# Patient Record
Sex: Male | Born: 1960 | Race: Black or African American | Hispanic: No | Marital: Married | Smoking: Never smoker
Health system: Southern US, Community
[De-identification: ages and names within clinical notes are randomized; demographics above are authoritative.]

## PROBLEM LIST (undated history)

## (undated) DIAGNOSIS — E7800 Pure hypercholesterolemia, unspecified: Secondary | ICD-10-CM

## (undated) DIAGNOSIS — I10 Essential (primary) hypertension: Secondary | ICD-10-CM

## (undated) DIAGNOSIS — K219 Gastro-esophageal reflux disease without esophagitis: Secondary | ICD-10-CM

## (undated) HISTORY — PX: ANKLE FRACTURE SURGERY: SHX122

---

## 2011-09-27 ENCOUNTER — Emergency Department (HOSPITAL_BASED_OUTPATIENT_CLINIC_OR_DEPARTMENT_OTHER)
Admission: EM | Admit: 2011-09-27 | Discharge: 2011-09-27 | Disposition: A | Discharge status: Home / Self Care | Payer: Managed Care, Other (non HMO) | Attending: Emergency Medicine | Admitting: Emergency Medicine

## 2011-09-27 ENCOUNTER — Encounter (HOSPITAL_BASED_OUTPATIENT_CLINIC_OR_DEPARTMENT_OTHER): Payer: Self-pay | Admitting: *Deleted

## 2011-09-27 DIAGNOSIS — W261XXA Contact with sword or dagger, initial encounter: Secondary | ICD-10-CM | POA: Insufficient documentation

## 2011-09-27 DIAGNOSIS — W260XXA Contact with knife, initial encounter: Secondary | ICD-10-CM | POA: Insufficient documentation

## 2011-09-27 DIAGNOSIS — S61209A Unspecified open wound of unspecified finger without damage to nail, initial encounter: Secondary | ICD-10-CM | POA: Insufficient documentation

## 2011-09-27 DIAGNOSIS — Z79899 Other long term (current) drug therapy: Secondary | ICD-10-CM | POA: Insufficient documentation

## 2011-09-27 DIAGNOSIS — S61219A Laceration without foreign body of unspecified finger without damage to nail, initial encounter: Secondary | ICD-10-CM

## 2011-09-27 DIAGNOSIS — M79609 Pain in unspecified limb: Secondary | ICD-10-CM | POA: Insufficient documentation

## 2011-09-27 DIAGNOSIS — Z7982 Long term (current) use of aspirin: Secondary | ICD-10-CM | POA: Insufficient documentation

## 2011-09-27 MED ORDER — LIDOCAINE HCL 2 % IJ SOLN
10.0000 mL | Freq: Once | INTRAMUSCULAR | Status: DC
  Filled 2011-09-27: qty 1

## 2011-09-27 NOTE — Discharge Instructions (Signed)
Fingertip Laceration  The treatment of fingertip injuries depends on how large the cut is and whether the bone or nail tissue has been damaged. Amputations of the skin over the tip of the finger that is smaller than a dime (smaller than 1cm) will usually heal very well from the sides without any treatment other than cleaning the wound and changing the dressing.  Keep your hand elevated for the next 2 to 3 days to reduce pain and swelling. A splint over the fingertip may be needed to protect your injury. If your cut is being allowed to heal in from the sides, you should soak it in warm water and change the dressing daily.   You may need a tetanus shot if:   You cannot remember when you had your last tetanus shot.   You have never had a tetanus shot.   The injury broke your skin.  If you got a tetanus shot, your arm may swell, get red, and feel warm to the touch. This is common and not a problem. If you need a tetanus shot and you choose not to have one, there is a rare chance of getting tetanus. Sickness from tetanus can be serious.  SEEK MEDICAL CARE IF:    There are any signs of infection: increased redness, swelling, and pain, or sometimes pus drainage.  Document Released: 05/08/2004 Document Revised: 03/20/2011 Document Reviewed: 05/04/2008  [employer] Patient Information 2012 [employer].

## 2011-09-27 NOTE — ED Notes (Signed)
Pt states he cut his right index finger with a knife. Approx 1 cm lac to same. Bleeding controlled.

## 2011-09-27 NOTE — ED Provider Notes (Signed)
History     CSN: [id number]  Arrival date & time 09/27/11  2017   First MD Initiated Contact with Patient 09/27/11 2101      10 PM HPI Patient reports reaching his right hand into it to her. States he accidentally stabbed his right distal index finger with a knife. Reports bleeding significantly better controlled with pressure. Denies numbness, tingling, decreased range of motion. Patient is a 51 y.o. male presenting with skin laceration. The history is provided by the patient.  Laceration  The incident occurred 1 to 2 hours ago. The laceration is located on the right hand. The laceration is 1 cm in size. The laceration mechanism was a a clean knife. The pain is moderate. The pain has been constant since onset. His tetanus status is UTD.    History reviewed. No pertinent past medical history.  History reviewed. No pertinent past surgical history.  History reviewed. No pertinent family history.  History  Substance Use Topics  . Smoking status: Never Smoker   . Smokeless tobacco: Not on file  . Alcohol Use: Yes      Review of Systems  Constitutional: Negative for fever.  Skin: Positive for wound (laceration). Negative for color change.  All other systems reviewed and are negative.    Allergies  Review of patient's allergies indicates no known allergies.  Home Medications   Current Outpatient Rx  Name Route Sig Dispense Refill  . ASPIRIN 81 MG PO TABS Oral Take 81 mg by mouth daily.    . ADULT MULTIVITAMIN W/MINERALS CH Oral Take 1 tablet by mouth daily.    Marland Kitchen NAPROXEN SODIUM 220 MG PO TABS Oral Take 220 mg by mouth daily.    Marland Kitchen FISH OIL PO Oral Take 1 capsule by mouth daily.    Marland Kitchen OVER THE COUNTER MEDICATION Oral Take 1 tablet by mouth daily. Joint soother    . OVER THE COUNTER MEDICATION Oral Take 1 tablet by mouth daily. Vitamin for high blood pressure      BP 155/95  Pulse 84  Temp 98.5 F (36.9 C) (Oral)  Resp 20  Ht 5' 7.5" (1.715 m)  Wt 190 lb (86.183 kg)   BMI 29.32 kg/m2  SpO2 98%  Physical Exam  Vitals reviewed. Constitutional: He is oriented to person, place, and time. He appears well-developed and well-nourished.  HENT:  Head: Normocephalic and atraumatic.  Eyes: Pupils are equal, round, and reactive to light.  Musculoskeletal:       Small 1 cm laceration of right 2nd finger. No tendon or muscle laceration. Normal flexor and extensor function and strength against resistance. Hemostatic.   Neurological: He is alert and oriented to person, place, and time.  Skin: Skin is warm and dry. No rash noted. No erythema. No pallor.  Psychiatric: He has a normal mood and affect. His behavior is normal.    ED Course  Procedures   LACERATION REPAIR Performed by: Thomasene Lot Authorized by: Thomasene Lot Consent: Verbal consent obtained. Risks and benefits: risks, benefits and alternatives were discussed Consent given by: patient Patient identity confirmed: provided demographic data Prepped and Draped in normal sterile fashion Wound explored  Laceration Location: right index finger  Laceration Length: 1cm  No Foreign Bodies seen or palpated  Anesthesia: digital block  Local anesthetic: lidocaine 2% w/o epinephrine  Anesthetic total: 3 ml  Irrigation method: syringe Amount of cleaning: standard  Skin closure: Suture  Number of sutures: 3  Technique: simple interrupted   Patient tolerance: Patient tolerated  the procedure well with no immediate complications.   MDM   Advise removal of 3 sutures in 10 days. Also advised patient to monitor for signs of infection. Patient reports last tetanus was approximately 2 years ago and he had suture repair of left ankle. Patient voices understanding and is ready for discharge.      Thomasene Lot, PA-C 09/27/11 2350

## 2011-09-28 NOTE — ED Provider Notes (Signed)
Medical screening examination/treatment/procedure(s) were performed by non-physician practitioner and as supervising physician I was immediately available for consultation/collaboration.   Leigh-Ann Nhat Hearne, MD 09/28/11 0935 

## 2014-04-14 ENCOUNTER — Emergency Department (HOSPITAL_BASED_OUTPATIENT_CLINIC_OR_DEPARTMENT_OTHER)
Admission: EM | Admit: 2014-04-14 | Discharge: 2014-04-14 | Disposition: A | Discharge status: Home / Self Care | Payer: Managed Care, Other (non HMO) | Attending: Emergency Medicine | Admitting: Emergency Medicine

## 2014-04-14 ENCOUNTER — Emergency Department (HOSPITAL_BASED_OUTPATIENT_CLINIC_OR_DEPARTMENT_OTHER): Payer: Managed Care, Other (non HMO)

## 2014-04-14 ENCOUNTER — Encounter (HOSPITAL_BASED_OUTPATIENT_CLINIC_OR_DEPARTMENT_OTHER): Payer: Self-pay | Admitting: *Deleted

## 2014-04-14 DIAGNOSIS — Z8719 Personal history of other diseases of the digestive system: Secondary | ICD-10-CM | POA: Insufficient documentation

## 2014-04-14 DIAGNOSIS — I10 Essential (primary) hypertension: Secondary | ICD-10-CM | POA: Insufficient documentation

## 2014-04-14 DIAGNOSIS — Z79899 Other long term (current) drug therapy: Secondary | ICD-10-CM | POA: Insufficient documentation

## 2014-04-14 DIAGNOSIS — E780 Pure hypercholesterolemia: Secondary | ICD-10-CM | POA: Insufficient documentation

## 2014-04-14 DIAGNOSIS — Z7982 Long term (current) use of aspirin: Secondary | ICD-10-CM | POA: Diagnosis not present

## 2014-04-14 DIAGNOSIS — J069 Acute upper respiratory infection, unspecified: Secondary | ICD-10-CM

## 2014-04-14 DIAGNOSIS — R05 Cough: Secondary | ICD-10-CM | POA: Diagnosis present

## 2014-04-14 HISTORY — DX: Pure hypercholesterolemia, unspecified: E78.00

## 2014-04-14 HISTORY — DX: Gastro-esophageal reflux disease without esophagitis: K21.9

## 2014-04-14 HISTORY — DX: Essential (primary) hypertension: I10

## 2014-04-14 MED ORDER — HYDROCOD POLST-CHLORPHEN POLST 10-8 MG/5ML PO LQCR: 5.0000 mL | Freq: Two times a day (BID) | ORAL | Status: AC | PRN

## 2014-04-14 MED ORDER — IBUPROFEN 400 MG PO TABS
400.0000 mg | ORAL_TABLET | Freq: Once | ORAL | Status: AC
  Administered 2014-04-14: 400 mg via ORAL
  Filled 2014-04-14: qty 1

## 2014-04-14 NOTE — ED Provider Notes (Signed)
CSN: [id number]     Arrival date & time 04/14/14  1906 History   First MD Initiated Contact with Patient 04/14/14 2047     Chief Complaint  Patient presents with  . Cough     (Consider location/radiation/quality/duration/timing/severity/associated sxs/prior Treatment) HPI Comments: Pt comes in with c/o subjective fever, body aches, sore throat, headache and cough. Has tried otc medications without relief. No n/v/d, no cp or sob.  The history is provided by the patient. No language interpreter was used.    Past Medical History  Diagnosis Date  . Hypercholesterolemia   . Hypertension   . Acid reflux    Past Surgical History  Procedure Laterality Date  . Ankle fracture surgery     History reviewed. No pertinent family history. History  Substance Use Topics  . Smoking status: Never Smoker   . Smokeless tobacco: Not on file  . Alcohol Use: Yes     Comment: rare    Review of Systems  All other systems reviewed and are negative.     Allergies  Review of patient's allergies indicates no known allergies.  Home Medications   Prior to Admission medications   Medication Sig Start Date End Date Taking? Authorizing Provider  aspirin 81 MG tablet Take 81 mg by mouth daily.    Historical Provider, MD  Multiple Vitamin (MULTIVITAMIN WITH MINERALS) TABS Take 1 tablet by mouth daily.    Historical Provider, MD  naproxen sodium (ANAPROX) 220 MG tablet Take 220 mg by mouth daily.    Historical Provider, MD  Omega-3 Fatty Acids (FISH OIL PO) Take 1 capsule by mouth daily.    Historical Provider, MD  OVER THE COUNTER MEDICATION Take 1 tablet by mouth daily. Joint soother    Historical Provider, MD  OVER THE COUNTER MEDICATION Take 1 tablet by mouth daily. Vitamin for high blood pressure    Historical Provider, MD   BP 160/104 mmHg  Pulse 109  Temp(Src) 99.9 F (37.7 C) (Oral)  Resp 18  Wt 201 lb 12.8 oz (91.536 kg)  SpO2 96% Physical Exam  Constitutional: He appears  well-developed and well-nourished.  HENT:  Right Ear: External ear normal.  Left Ear: External ear normal.  Nose: Rhinorrhea present.  Mouth/Throat: Posterior oropharyngeal erythema present.  Cardiovascular: Normal rate and regular rhythm.   Pulmonary/Chest: Effort normal and breath sounds normal.  Abdominal: Soft. Bowel sounds are normal.  Musculoskeletal: Normal range of motion.  Neurological: He is alert.  Nursing note and vitals reviewed.   ED Course  Procedures (including critical care time) Labs Review Labs Reviewed - No data to display  Imaging Review Dg Chest 2 View  04/14/2014   CLINICAL DATA:  Cough.  EXAM: CHEST  2 VIEW  COMPARISON:  None.  FINDINGS: The heart size and mediastinal contours are within normal limits. Both lungs are clear. No pneumothorax or pleural effusion is noted. The visualized skeletal structures are unremarkable.  IMPRESSION: No acute cardiopulmonary abnormality seen.   Electronically Signed   By: Roque Lias M.D.   On: 04/14/2014 21:05     EKG Interpretation None      MDM   Final diagnoses:  URI (upper respiratory infection)    Will do symptomatic treatment for pt. No infection noted on x-ray    Teressa Lower, NP 04/14/14 2122  Rolan Bucco, MD 04/14/14 2127

## 2014-04-14 NOTE — ED Notes (Addendum)
C/o cough and subjective fever, also reports body aches, sore throat with cough and HA with cough. Onset 2d ago. Has taken mucinex and riccola cough drops. No antipyretics PTA. (denies: sob, dizziness or nvd).  LS CTA. Pt alert, NAD, calm, interactive. Steady gait. Family x2 present.

## 2014-04-14 NOTE — Discharge Instructions (Signed)
Upper Respiratory Infection, Adult An upper respiratory infection (URI) is also known as the common cold. It is often caused by a type of germ (virus). Colds are easily spread (contagious). You can pass it to others by kissing, coughing, sneezing, or drinking out of the same glass. Usually, you get better in 1 or 2 weeks.  HOME CARE   Only take medicine as told by your doctor.  Use a warm mist humidifier or breathe in steam from a hot shower.  Drink enough water and fluids to keep your pee (urine) clear or pale yellow.  Get plenty of rest.  Return to work when your temperature is back to normal or as told by your doctor. You may use a face mask and wash your hands to stop your cold from spreading. GET HELP RIGHT AWAY IF:   After the first few days, you feel you are getting worse.  You have questions about your medicine.  You have chills, shortness of breath, or brown or red spit (mucus).  You have yellow or brown snot (nasal discharge) or pain in the face, especially when you bend forward.  You have a fever, puffy (swollen) neck, pain when you swallow, or white spots in the back of your throat.  You have a bad headache, ear pain, sinus pain, or chest pain.  You have a high-pitched whistling sound when you breathe in and out (wheezing).  You have a lasting cough or cough up blood.  You have sore muscles or a stiff neck. MAKE SURE YOU:   Understand these instructions.  Will watch your condition.  Will get help right away if you are not doing well or get worse. Document Released: 09/17/2007 Document Revised: 06/23/2011 Document Reviewed: 07/06/2013 Exit[employer] Patient Information 2015 [employer]. This information is not intended to replace advice given to you by your health care provider. Make sure you discuss any questions you have with your health care provider.  

## 2015-12-10 IMAGING — CR DG CHEST 2V
2 series · 2 of 2 positions shown · non-contrast
Comparison: None.

CLINICAL DATA: Cough.

EXAM:
CHEST  2 VIEW

[w chest pa]
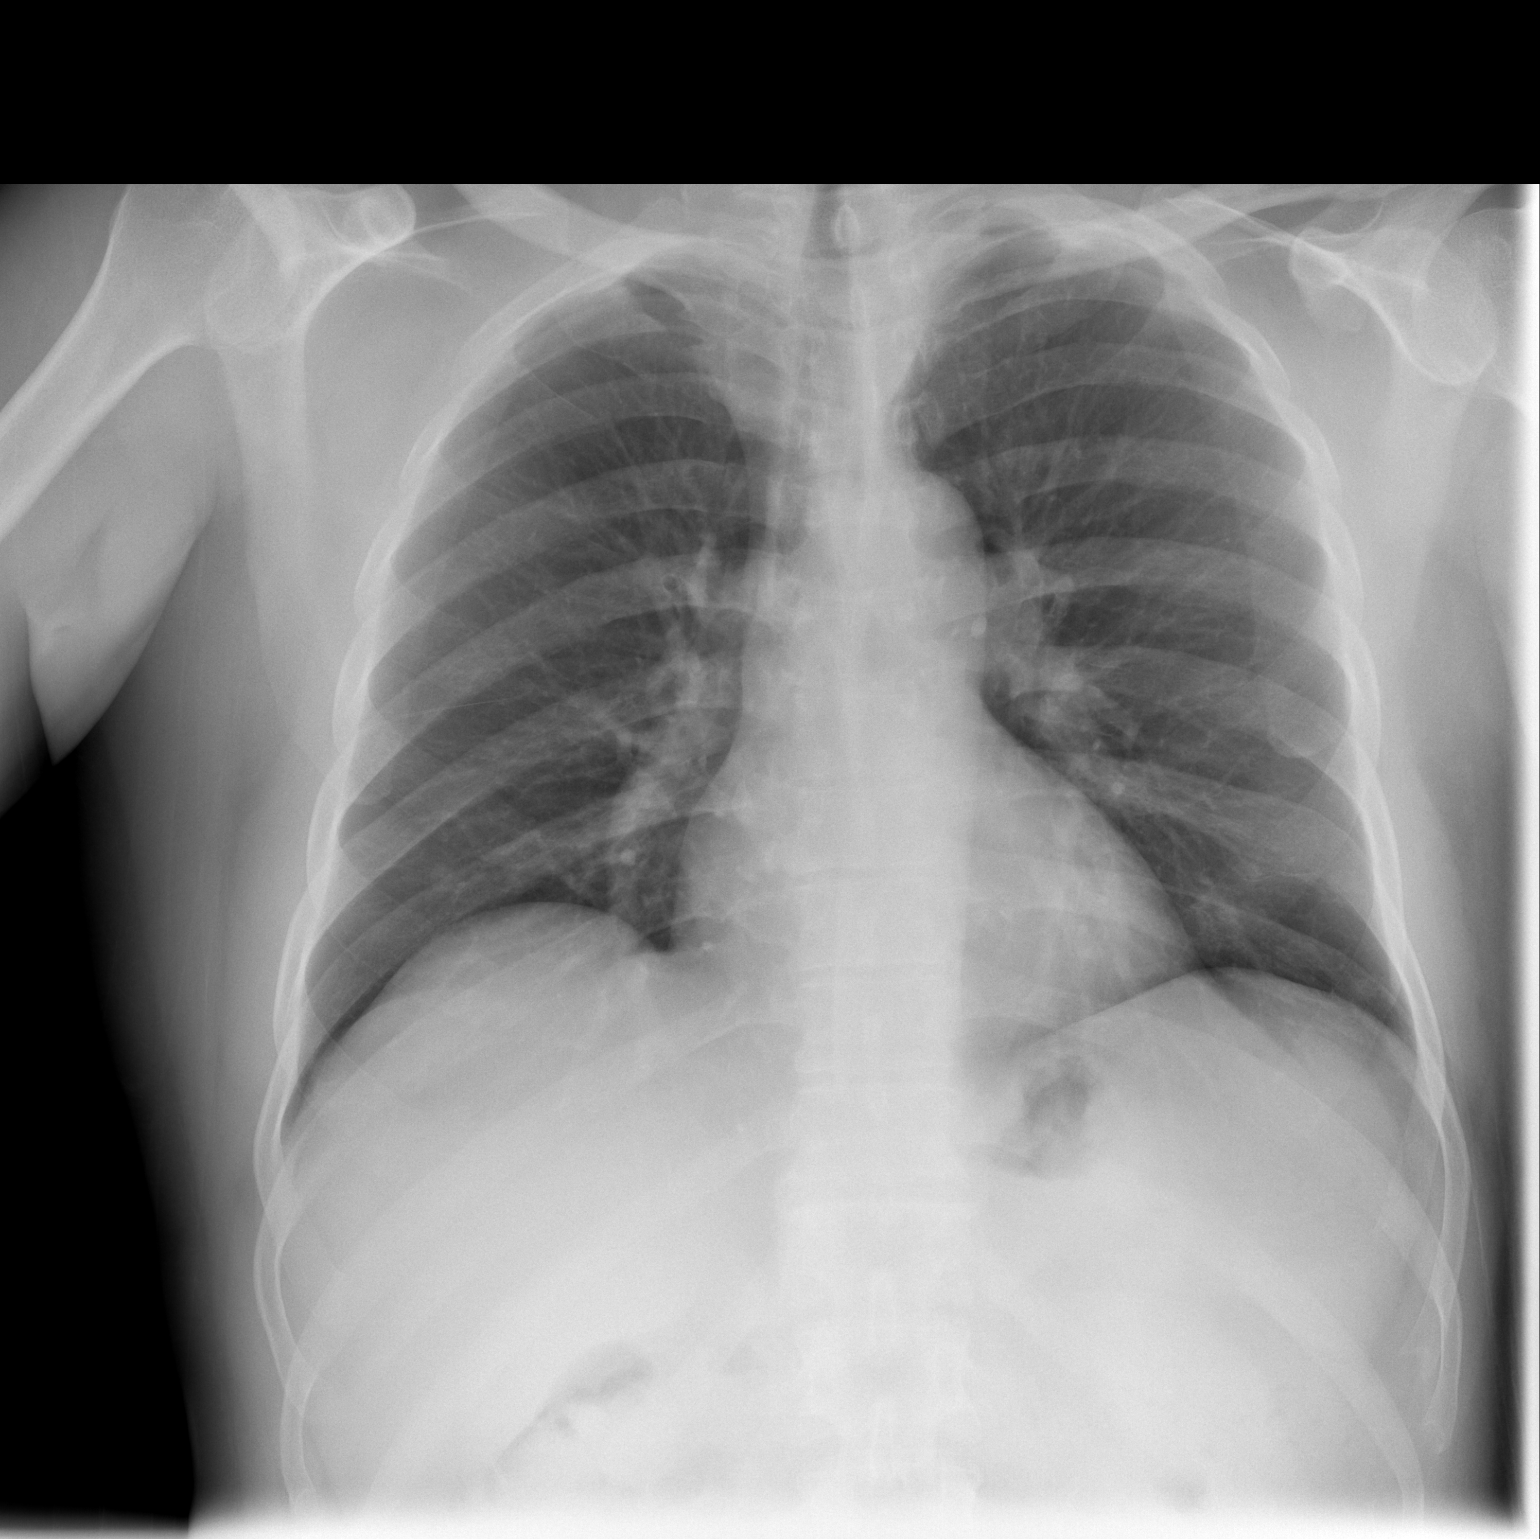

[w chest lat]
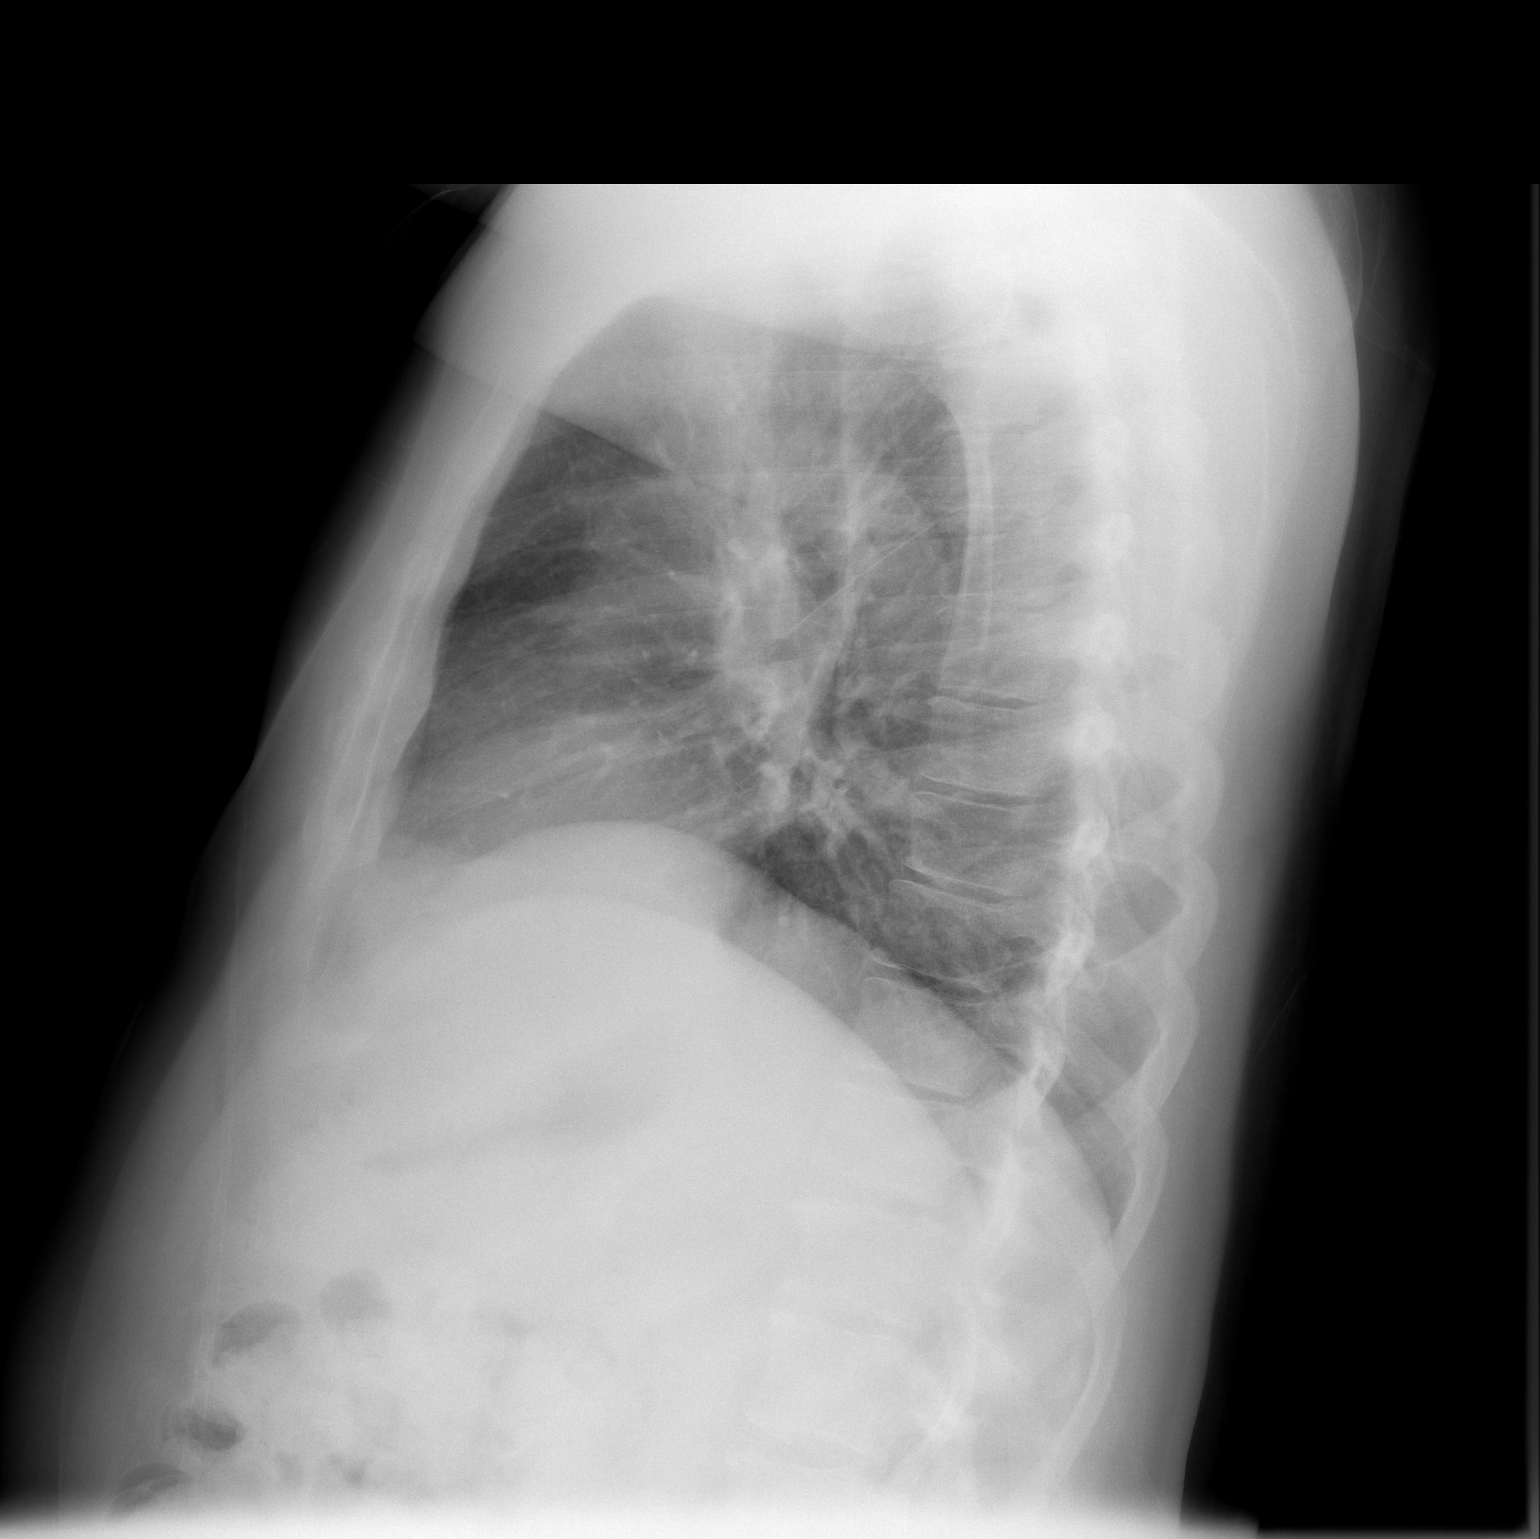

[2 of 2 positions shown; findings below may reference images not displayed]

FINDINGS: The heart size and mediastinal contours are within normal limits.
Both lungs are clear. No pneumothorax or pleural effusion is noted.
The visualized skeletal structures are unremarkable.
IMPRESSION: No acute cardiopulmonary abnormality seen.

## 2021-12-15 ENCOUNTER — Emergency Department (HOSPITAL_BASED_OUTPATIENT_CLINIC_OR_DEPARTMENT_OTHER)
Admission: EM | Admit: 2021-12-15 | Discharge: 2021-12-15 | Disposition: A | Discharge status: Home / Self Care | Payer: Managed Care, Other (non HMO) | Attending: Emergency Medicine | Admitting: Emergency Medicine

## 2021-12-15 ENCOUNTER — Encounter (HOSPITAL_BASED_OUTPATIENT_CLINIC_OR_DEPARTMENT_OTHER): Payer: Self-pay | Admitting: Emergency Medicine

## 2021-12-15 ENCOUNTER — Other Ambulatory Visit: Payer: Self-pay

## 2021-12-15 DIAGNOSIS — I10 Essential (primary) hypertension: Secondary | ICD-10-CM | POA: Diagnosis not present

## 2021-12-15 DIAGNOSIS — R369 Urethral discharge, unspecified: Secondary | ICD-10-CM

## 2021-12-15 DIAGNOSIS — L299 Pruritus, unspecified: Secondary | ICD-10-CM | POA: Diagnosis not present

## 2021-12-15 LAB — HIV ANTIBODY (ROUTINE TESTING W REFLEX): HIV Screen 4th Generation wRfx: NONREACTIVE

## 2021-12-15 MED ORDER — DOXYCYCLINE HYCLATE 100 MG PO CAPS: 100.0000 mg | ORAL_CAPSULE | Freq: Two times a day (BID) | ORAL | 0 refills | Status: AC

## 2021-12-15 MED ORDER — CEFTRIAXONE SODIUM 500 MG IJ SOLR
500.0000 mg | Freq: Once | INTRAMUSCULAR | Status: AC
  Administered 2021-12-15: 500 mg via INTRAMUSCULAR
  Filled 2021-12-15: qty 500

## 2021-12-15 MED ORDER — DOXYCYCLINE HYCLATE 100 MG PO TABS
100.0000 mg | ORAL_TABLET | Freq: Once | ORAL | Status: AC
  Administered 2021-12-15: 100 mg via ORAL
  Filled 2021-12-15: qty 1

## 2021-12-15 NOTE — ED Provider Notes (Signed)
Emergency Department Provider Note   I have reviewed the triage vital signs and the nursing notes.   HISTORY  Chief Complaint Exposure to STD   HPI Cochise Foglio is a 61 y.o. male with PMH of HTN and HLD presents to the ED with urethral discharge. He is having unprotected sex currently with a single partner. No fever or chills. No testicle pain.  Patient describes some itching and discomfort with urination.   Past Medical History:  Diagnosis Date   Acid reflux    Hypercholesterolemia    Hypertension     Review of Systems  Constitutional: No fever/chills Gastrointestinal: No abdominal pain.  Genitourinary: Mild dysuria and urethral discharge.  ____________________________________________   PHYSICAL EXAM:  VITAL SIGNS: ED Triage Vitals [12/15/21 0954]  Enc Vitals Group     BP (!) 162/98     Pulse Rate 94     Resp 18     Temp 98.3 F (36.8 C)     Temp Source Oral     SpO2 95 %   Constitutional: Alert and oriented. Well appearing and in no acute distress. Eyes: Conjunctivae are normal. Head: Atraumatic. Nose: No congestion/rhinnorhea. Mouth/Throat: Mucous membranes are moist.  Neck: No stridor.   Cardiovascular: Good peripheral circulation.  Respiratory: Normal respiratory effort.  Gastrointestinal: Soft and nontender. No distention.  Genitourinary: Exam performed with patient's verbal consent and with nurse chaperone.  Patient has urethral discharge visible without erythema to the glans.  No fungal type rash appreciated.  Testicles are normal lye and non-tender.  Musculoskeletal: No gross deformities of extremities. Neurologic:  Normal speech and language. Skin:  Skin is warm, dry and intact. No rash noted.  ____________________________________________   LABS (all labs ordered are listed, but only abnormal results are displayed)  Labs Reviewed  HIV ANTIBODY (ROUTINE TESTING W REFLEX)  RPR  GC/CHLAMYDIA PROBE AMP ([place]) NOT AT [facility]    ____________________________________________   PROCEDURES  Procedure(s) performed:   Procedures  None  ____________________________________________   INITIAL IMPRESSION / ASSESSMENT AND PLAN / ED COURSE  Pertinent labs & imaging results that were available during my care of the patient were reviewed by me and considered in my medical decision making (see chart for details).   This patient is Presenting for Evaluation of urethral discharge, which does require a range of treatment options, and is a complaint that involves a moderate risk of morbidity and mortality.  The Differential Diagnoses include STI, UTI, orchitis, prostatitis, etc.  Critical Interventions-    Medications  cefTRIAXone (ROCEPHIN) injection 500 mg (500 mg Intramuscular Given 12/15/21 1016)  doxycycline (VIBRA-TABS) tablet 100 mg (100 mg Oral Given 12/15/21 1017)     Clinical Laboratory Tests Ordered, included STI testing along with HIV/RPR sent and patient to follow the test results in the MyChart app but plan to treat empirically given exam.   Medical Decision Making: Summary:  Patient presents emergency department with what appears to be a sexually transmitted infection.  He is having unprotected sex currently.  We discussed empiric treatment here given his exam and will send testing to the lab but results will not come back today.  He will follow them in the MyChart app.  Discussed barrier protection/condom use along with partner notification during treatment.   Disposition: discharge  ____________________________________________  FINAL CLINICAL IMPRESSION(S) / ED DIAGNOSES  Final diagnoses:  Penile discharge    NEW OUTPATIENT MEDICATIONS STARTED DURING THIS VISIT:  New Prescriptions   DOXYCYCLINE (VIBRAMYCIN) 100 MG CAPSULE  Take 1 capsule (100 mg total) by mouth 2 (two) times daily for 7 days.    Note:  This document was prepared using Dragon voice recognition software and may include  unintentional dictation errors.  Alona Bene, MD, [facility]    Nihira Puello, Arlyss Repress, MD 12/15/21 1034

## 2021-12-15 NOTE — ED Triage Notes (Signed)
Patient reports that he has itching  in his groin area.Reports yellow discharge. Denies any dysuria . Denies any hematuria.States the itching and discharge started yesterday.States he has not had sexual contact in 4 days

## 2021-12-15 NOTE — Discharge Instructions (Signed)

## 2021-12-16 LAB — RPR: RPR Ser Ql: NONREACTIVE

## 2021-12-17 LAB — GC/CHLAMYDIA PROBE AMP (~~LOC~~) NOT AT ARMC
Chlamydia: NEGATIVE
Comment: NEGATIVE
Comment: NORMAL
Neisseria Gonorrhea: POSITIVE — AB
